# Patient Record
Sex: Male | Born: 1985 | Race: Black or African American | Hispanic: No | Marital: Single | Smoking: Never smoker
Health system: Southern US, Community
[De-identification: ages and names within clinical notes are randomized; demographics above are authoritative.]

---

## 2020-02-19 ENCOUNTER — Ambulatory Visit: Payer: No Typology Code available for payment source | Attending: Internal Medicine

## 2020-02-19 DIAGNOSIS — Z20822 Contact with and (suspected) exposure to covid-19: Secondary | ICD-10-CM

## 2020-02-20 LAB — SARS-COV-2, NAA 2 DAY TAT

## 2020-02-20 LAB — NOVEL CORONAVIRUS, NAA: SARS-CoV-2, NAA: NOT DETECTED

## 2020-09-14 ENCOUNTER — Encounter (HOSPITAL_COMMUNITY): Payer: Self-pay

## 2020-09-14 ENCOUNTER — Emergency Department (HOSPITAL_COMMUNITY): Payer: No Typology Code available for payment source

## 2020-09-14 ENCOUNTER — Emergency Department (HOSPITAL_COMMUNITY)
Admission: EM | Admit: 2020-09-14 | Discharge: 2020-09-14 | Disposition: A | Discharge status: Home / Self Care | Payer: No Typology Code available for payment source | Attending: Emergency Medicine | Admitting: Emergency Medicine

## 2020-09-14 ENCOUNTER — Other Ambulatory Visit: Payer: Self-pay

## 2020-09-14 DIAGNOSIS — M25532 Pain in left wrist: Secondary | ICD-10-CM | POA: Diagnosis present

## 2020-09-14 DIAGNOSIS — Y9389 Activity, other specified: Secondary | ICD-10-CM | POA: Insufficient documentation

## 2020-09-14 DIAGNOSIS — M79604 Pain in right leg: Secondary | ICD-10-CM | POA: Insufficient documentation

## 2020-09-14 DIAGNOSIS — Y92410 Unspecified street and highway as the place of occurrence of the external cause: Secondary | ICD-10-CM | POA: Insufficient documentation

## 2020-09-14 DIAGNOSIS — M79605 Pain in left leg: Secondary | ICD-10-CM | POA: Insufficient documentation

## 2020-09-14 DIAGNOSIS — M7918 Myalgia, other site: Secondary | ICD-10-CM

## 2020-09-14 MED ORDER — METHOCARBAMOL 500 MG PO TABS: 500.0000 mg | ORAL_TABLET | Freq: Two times a day (BID) | ORAL | 0 refills | Status: AC

## 2020-09-14 MED ORDER — NAPROXEN 500 MG PO TABS: 500.0000 mg | ORAL_TABLET | Freq: Two times a day (BID) | ORAL | 0 refills | Status: AC
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
## 2020-09-14 NOTE — ED Notes (Signed)
Pt ambulated to the bathroom without assistance. Gait steady  
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
## 2020-09-14 NOTE — ED Notes (Signed)
Pt verbalized dc instructions and follow up care. Alert and ambulatory. No iv. Leaving with father
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
## 2021-08-07 IMAGING — DX DG ELBOW COMPLETE 3+V*L*
4 series · 4 of 4 positions shown · non-contrast
Comparison: None.

CLINICAL DATA: Questionable radial neck fracture

EXAM:
LEFT ELBOW - COMPLETE 3+ VIEW

[elbow ap]
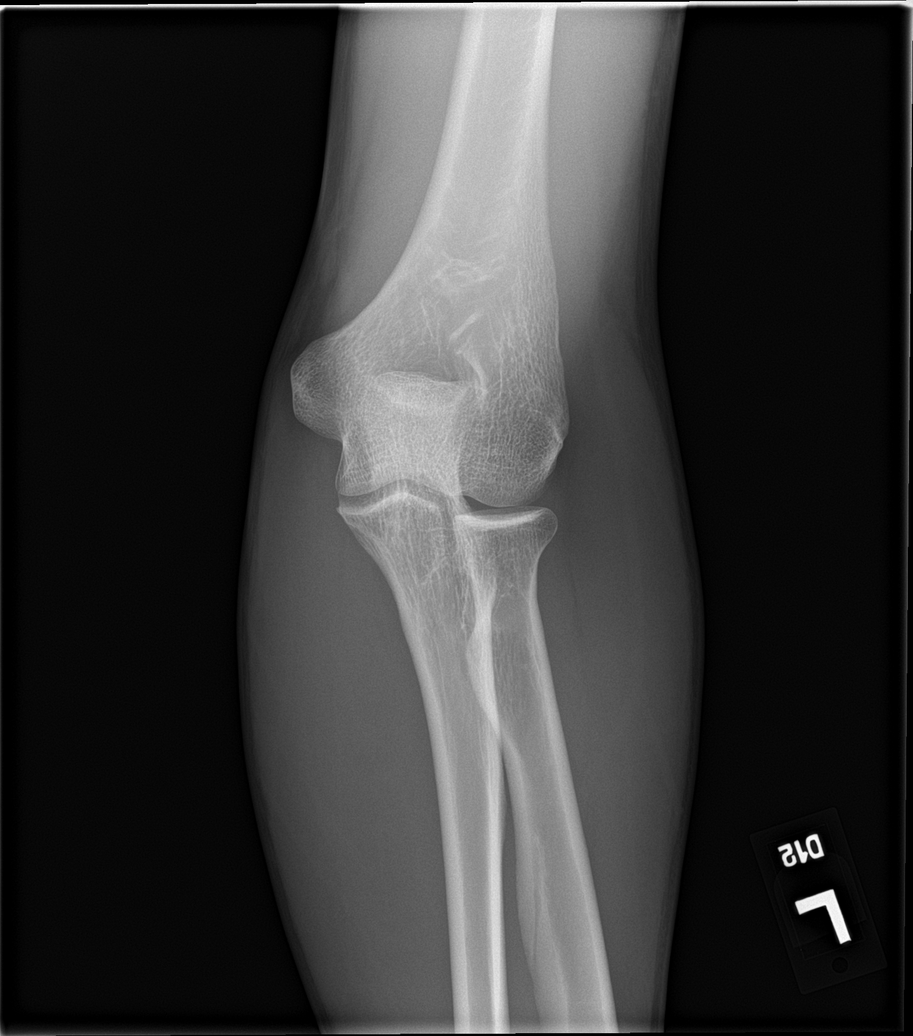

[elbow obl (1 of 2)]
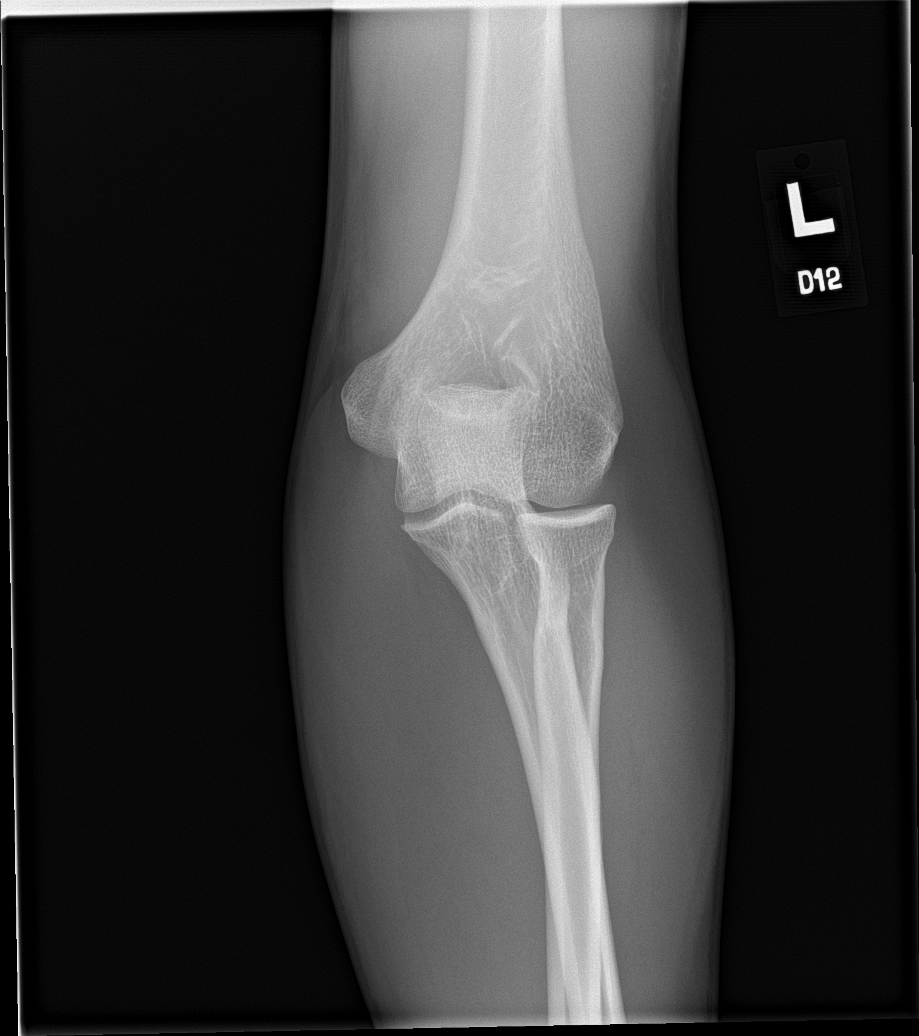

[elbow obl (2 of 2)]
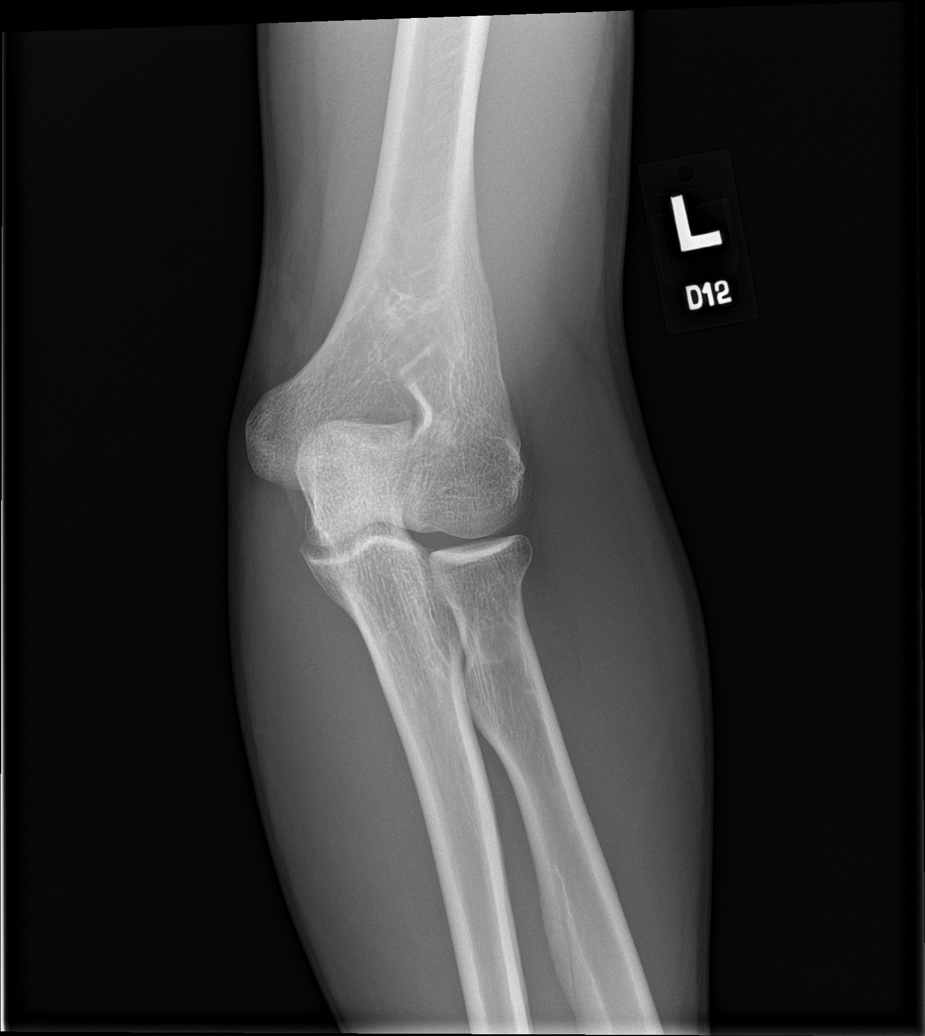

[elbow lat]
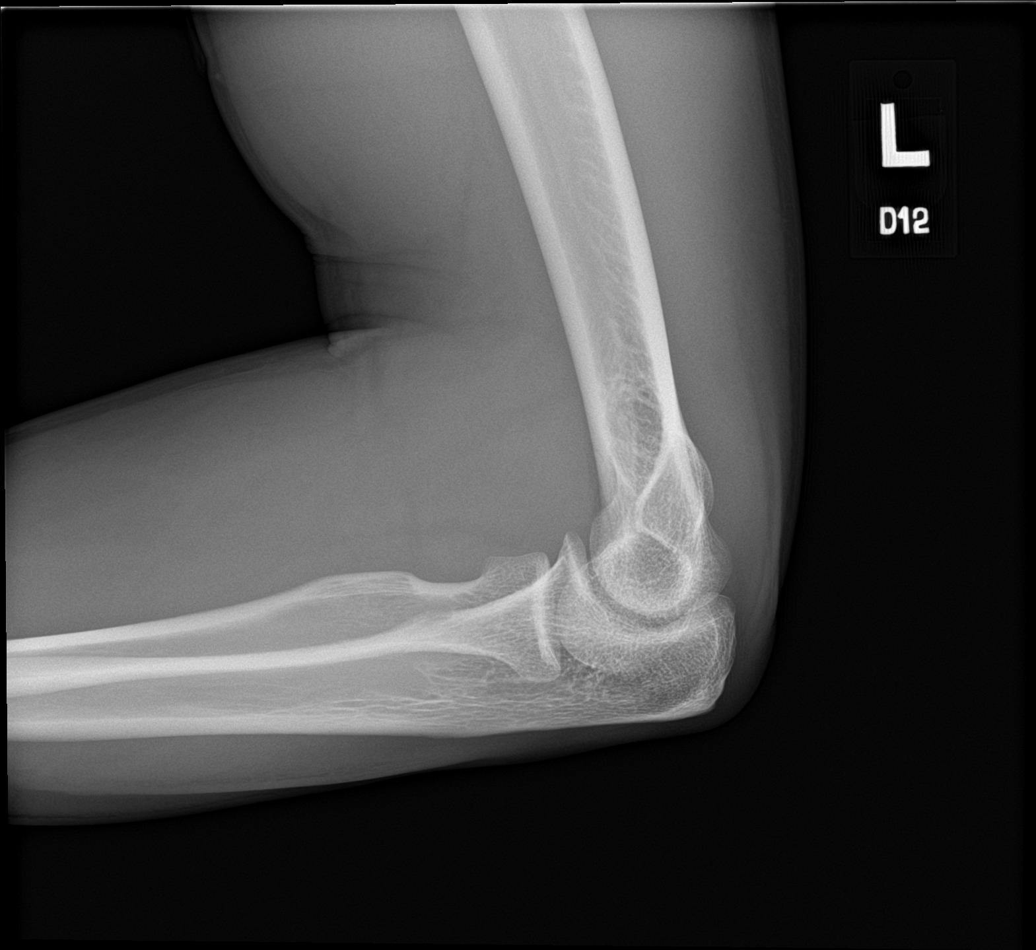

[4 of 4 positions shown; findings below may reference images not displayed]

FINDINGS: No fracture or dislocation is seen. Specifically, while there is a
slightly unusual appearance of the radial neck on the lateral view,
there is no definite fracture lucency.

The joint spaces are preserved.

Visualized soft tissues are within normal limits.

No displaced elbow joint fat pads on the lateral view to suggest an
elbow joint effusion. An accompanying elbow joint effusion would be
suspected in the setting of occult radial neck fracture.
IMPRESSION: Negative.
